# Patient Record
Sex: Female | Born: 1980 | Race: White | Hispanic: No | Marital: Married | Smoking: Current every day smoker
Health system: Southern US, Community
[De-identification: ages and names within clinical notes are randomized; demographics above are authoritative.]

## PROBLEM LIST (undated history)

## (undated) HISTORY — PX: OTHER SURGICAL HISTORY: SHX169

## (undated) HISTORY — PX: TUBAL LIGATION: SHX77

## (undated) HISTORY — PX: WISDOM TOOTH EXTRACTION: SHX21

---

## 2011-10-02 ENCOUNTER — Emergency Department (HOSPITAL_BASED_OUTPATIENT_CLINIC_OR_DEPARTMENT_OTHER)
Admission: EM | Admit: 2011-10-02 | Discharge: 2011-10-02 | Disposition: A | Discharge status: Home / Self Care | Payer: Self-pay | Attending: Emergency Medicine | Admitting: Emergency Medicine

## 2011-10-02 DIAGNOSIS — F17200 Nicotine dependence, unspecified, uncomplicated: Secondary | ICD-10-CM | POA: Insufficient documentation

## 2011-10-02 DIAGNOSIS — R109 Unspecified abdominal pain: Secondary | ICD-10-CM | POA: Insufficient documentation

## 2011-10-02 DIAGNOSIS — R51 Headache: Secondary | ICD-10-CM | POA: Insufficient documentation

## 2011-10-02 DIAGNOSIS — B349 Viral infection, unspecified: Secondary | ICD-10-CM

## 2011-10-02 DIAGNOSIS — B9789 Other viral agents as the cause of diseases classified elsewhere: Secondary | ICD-10-CM | POA: Insufficient documentation

## 2011-10-02 LAB — BASIC METABOLIC PANEL
BUN: 12 mg/dL (ref 6–23)
CO2: 25 mEq/L (ref 19–32)
Calcium: 10.2 mg/dL (ref 8.4–10.5)
GFR calc non Af Amer: >90 mL/min (ref >90)
Glucose, Bld: 96 mg/dL (ref 70–99)
Potassium: 4.2 mEq/L (ref 3.5–5.1)
Sodium: 137 mEq/L (ref 135–145)

## 2011-10-02 LAB — URINE MICROSCOPIC-ADD ON

## 2011-10-02 LAB — URINALYSIS, ROUTINE W REFLEX MICROSCOPIC
Bilirubin Urine: NEGATIVE
Ketones, ur: NEGATIVE mg/dL
Nitrite: NEGATIVE
Specific Gravity, Urine: 1.013 (ref 1.005–1.030)
Urobilinogen, UA: 0.2 mg/dL (ref 0.0–1.0)

## 2011-10-02 MED ORDER — FAMOTIDINE 20 MG PO TABS: 20.0000 mg | ORAL_TABLET | Freq: Two times a day (BID) | ORAL | Status: AC

## 2011-10-02 MED ORDER — GI COCKTAIL ~~LOC~~
30.0000 mL | Freq: Once | ORAL | Status: AC
  Administered 2011-10-02: 30 mL via ORAL
  Filled 2011-10-02: qty 30

## 2011-10-02 MED ORDER — ONDANSETRON HCL 4 MG PO TABS: 4.0000 mg | ORAL_TABLET | Freq: Four times a day (QID) | ORAL | Status: AC

## 2011-10-02 MED ORDER — FAMOTIDINE IN NACL 20-0.9 MG/50ML-% IV SOLN
20.0000 mg | Freq: Once | INTRAVENOUS | Status: AC
  Administered 2011-10-02: 20 mg via INTRAVENOUS
  Filled 2011-10-02: qty 50

## 2011-10-02 MED ORDER — SODIUM CHLORIDE 0.9 % IV BOLUS (SEPSIS)
1000.0000 mL | Freq: Once | INTRAVENOUS | Status: AC
  Administered 2011-10-02: 1000 mL via INTRAVENOUS

## 2011-10-02 MED ORDER — ONDANSETRON HCL 4 MG/2ML IJ SOLN
4.0000 mg | Freq: Once | INTRAMUSCULAR | Status: AC
  Administered 2011-10-02: 4 mg via INTRAVENOUS
  Filled 2011-10-02: qty 2

## 2011-10-02 MED ORDER — KETOROLAC TROMETHAMINE 30 MG/ML IJ SOLN
30.0000 mg | Freq: Once | INTRAMUSCULAR | Status: AC
  Administered 2011-10-02: 30 mg via INTRAVENOUS
  Filled 2011-10-02: qty 1

## 2011-10-02 NOTE — ED Provider Notes (Signed)
History     CSN: [id number] Arrival date & time: 10/02/2011  2:40 PM   First MD Initiated Contact with Patient 10/02/11 1527      Chief Complaint  Patient presents with  . Headache  . Abdominal Pain    (Consider location/radiation/quality/duration/timing/severity/associated sxs/prior treatment) HPI Patient p/w ~3d of complaints.  She notes that her Sx began gradually and since onset she has had persistent crampy mid abdominal pain, HA (R-sided, pressure-like) and anorexia w nausea.  She also c/o crusty discharge from her R eye.  The eye is not painful, nor does she c/o acuity changes, but she awakens each morning with crusty eyelids that need to be "pried" open.  She notes that w PO intake she feels worse.  No relief w OTC meds. No F/C, no emesis, diarrhea, no myalgia though there is generalized discomfort.  History reviewed. No pertinent past medical history.  Past Surgical History  Procedure Date  . Arm surgery     No family history on file.  History  Substance Use Topics  . Smoking status: Current Everyday Smoker  . Smokeless tobacco: Not on file  . Alcohol Use: No    OB History    Grav Para Term Preterm Abortions TAB SAB Ect Mult Living                  Review of Systems  Constitutional:       HPI  HENT:       HPI otherwise negative  Eyes: Negative.   Respiratory:       HPI, otherwise negative  Cardiovascular:       HPI, otherwise nmegative  Gastrointestinal: Negative for vomiting.  Genitourinary:       HPI, otherwise negative  Musculoskeletal:       HPI, otherwise negative  Skin: Negative.   Neurological: Negative for syncope.    Allergies  Review of patient's allergies indicates no known allergies.  Home Medications  No current outpatient prescriptions on file.  BP 127/71  Pulse 80  Temp(Src) 98.6 F (37 C) (Oral)  Resp 16  Ht 5\' 9"  (1.753 m)  Wt 184 lb (83.462 kg)  BMI 27.17 kg/m2  SpO2 100%  LMP 09/30/2011  Physical Exam  Nursing  note and vitals reviewed. Constitutional: She is oriented to person, place, and time. She appears well-developed and well-nourished. No distress.  HENT:  Head: Normocephalic and atraumatic.  Mouth/Throat: Oropharynx is clear and moist. No oropharyngeal exudate.  Eyes: Conjunctivae are normal. Pupils are equal, round, and reactive to light.       No discernable discharge  Neck: Neck supple.  Cardiovascular: Normal rate, regular rhythm, normal heart sounds and intact distal pulses.   Pulmonary/Chest: Effort normal and breath sounds normal. No stridor. No respiratory distress. She has no wheezes.  Abdominal: Soft. She exhibits no distension and no mass. There is no tenderness. There is no rebound and no guarding.  Musculoskeletal: She exhibits no edema and no tenderness.  Neurological: She is alert and oriented to person, place, and time. No cranial nerve deficit. She exhibits normal muscle tone. Coordination normal.  Skin: Skin is warm and dry. No rash noted. No erythema.  Psychiatric: She has a normal mood and affect.    ED Course  Procedures (including critical care time)   Labs Reviewed  URINALYSIS, ROUTINE W REFLEX MICROSCOPIC  PREGNANCY, URINE  BASIC METABOLIC PANEL   No results found.   No diagnosis found.   5:55 PM Patient notes that she  is feeling significantly better. MDM  This previously well young female presents with 3 days of generalized discomfort and right eye discharge.  Given the description of discharge, infectious etiology seems most likely. Following the patient's initial presentation she received antiemetics, analgesics. The patient noted significant improvement in her condition, and was discharged in stable condition to follow up with her primary care physician. She was not prescribe antibiotics, as there is no fever, no leukocytosis, no physical exam findings consistent with ongoing bacterial infection.        Gerhard Munch, MD 10/02/11 [phone]

## 2011-10-02 NOTE — ED Notes (Signed)
C/o HA behind right eye with "crusty" right eye-also c/o abd pressure/nausea-denies v/d

## 2011-10-02 NOTE — ED Notes (Signed)
Pt. Is being see by Dr. Jeraldine Loots at present time.

## 2011-10-02 NOTE — ED Notes (Signed)
Family at bedside. 

## 2011-10-02 NOTE — ED Notes (Signed)
Encouraged Pt. To urinate again.

## 2012-01-04 ENCOUNTER — Emergency Department (HOSPITAL_BASED_OUTPATIENT_CLINIC_OR_DEPARTMENT_OTHER)
Admission: EM | Admit: 2012-01-04 | Discharge: 2012-01-04 | Disposition: A | Discharge status: Home / Self Care | Payer: Medicaid Other | Attending: Emergency Medicine | Admitting: Emergency Medicine

## 2012-01-04 ENCOUNTER — Encounter (HOSPITAL_BASED_OUTPATIENT_CLINIC_OR_DEPARTMENT_OTHER): Payer: Self-pay | Admitting: *Deleted

## 2012-01-04 ENCOUNTER — Emergency Department (INDEPENDENT_AMBULATORY_CARE_PROVIDER_SITE_OTHER): Payer: Medicaid Other

## 2012-01-04 DIAGNOSIS — M545 Low back pain, unspecified: Secondary | ICD-10-CM | POA: Insufficient documentation

## 2012-01-04 DIAGNOSIS — M79609 Pain in unspecified limb: Secondary | ICD-10-CM | POA: Insufficient documentation

## 2012-01-04 DIAGNOSIS — S39012A Strain of muscle, fascia and tendon of lower back, initial encounter: Secondary | ICD-10-CM

## 2012-01-04 DIAGNOSIS — M5430 Sciatica, unspecified side: Secondary | ICD-10-CM

## 2012-01-04 LAB — URINALYSIS, ROUTINE W REFLEX MICROSCOPIC
Bilirubin Urine: NEGATIVE
Glucose, UA: NEGATIVE mg/dL
Hgb urine dipstick: NEGATIVE
Ketones, ur: NEGATIVE mg/dL
Leukocytes, UA: NEGATIVE
Nitrite: NEGATIVE
Protein, ur: NEGATIVE mg/dL
Specific Gravity, Urine: 1.02 (ref 1.005–1.030)
Urobilinogen, UA: 0.2 mg/dL (ref 0.0–1.0)
pH: 7.5 (ref 5.0–8.0)

## 2012-01-04 LAB — URINE MICROSCOPIC-ADD ON

## 2012-01-04 LAB — PREGNANCY, URINE: Preg Test, Ur: NEGATIVE

## 2012-01-04 MED ORDER — KETOROLAC TROMETHAMINE 60 MG/2ML IM SOLN
60.0000 mg | Freq: Once | INTRAMUSCULAR | Status: AC
  Administered 2012-01-04: 60 mg via INTRAMUSCULAR
  Filled 2012-01-04: qty 2

## 2012-01-04 MED ORDER — CYCLOBENZAPRINE HCL 10 MG PO TABS: 10.0000 mg | ORAL_TABLET | Freq: Three times a day (TID) | ORAL | Status: AC | PRN

## 2012-01-04 MED ORDER — PREDNISONE 50 MG PO TABS: 50.0000 mg | ORAL_TABLET | Freq: Every day | ORAL | Status: AC

## 2012-01-04 MED ORDER — OXYCODONE-ACETAMINOPHEN 5-325 MG PO TABS
1.0000 | ORAL_TABLET | Freq: Once | ORAL | Status: AC
  Administered 2012-01-04: 1 via ORAL
  Filled 2012-01-04: qty 1

## 2012-01-04 MED ORDER — HYDROCODONE-ACETAMINOPHEN 5-325 MG PO TABS: 1.0000 | ORAL_TABLET | Freq: Four times a day (QID) | ORAL | Status: AC | PRN

## 2012-01-04 NOTE — ED Notes (Signed)
Pt describes lower back pain x 6 weeks. Pain now radiating down the left leg. No urinary s/s.

## 2012-01-04 NOTE — ED Provider Notes (Signed)
Medical screening examination/treatment/procedure(s) were performed by non-physician practitioner and as supervising physician I was immediately available for consultation/collaboration.  Raeford Razor, MD 01/04/12 1620
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
## 2012-01-04 NOTE — Discharge Instructions (Signed)
Your x-rays were normal. Return here as needed. Follow up with the doctor provided. Use ice and heat on your lower back.

## 2012-01-05 LAB — URINE CULTURE
Colony Count: 40000
Culture  Setup Time: 201303172346

## 2012-05-03 ENCOUNTER — Encounter (HOSPITAL_BASED_OUTPATIENT_CLINIC_OR_DEPARTMENT_OTHER): Payer: Self-pay

## 2012-05-03 ENCOUNTER — Emergency Department (HOSPITAL_BASED_OUTPATIENT_CLINIC_OR_DEPARTMENT_OTHER): Payer: Self-pay

## 2012-05-03 ENCOUNTER — Emergency Department (HOSPITAL_BASED_OUTPATIENT_CLINIC_OR_DEPARTMENT_OTHER)
Admission: EM | Admit: 2012-05-03 | Discharge: 2012-05-03 | Disposition: A | Discharge status: Home / Self Care | Payer: Self-pay | Attending: Emergency Medicine | Admitting: Emergency Medicine

## 2012-05-03 DIAGNOSIS — S9030XA Contusion of unspecified foot, initial encounter: Secondary | ICD-10-CM

## 2012-05-03 DIAGNOSIS — Y9239 Other specified sports and athletic area as the place of occurrence of the external cause: Secondary | ICD-10-CM | POA: Insufficient documentation

## 2012-05-03 DIAGNOSIS — W2209XA Striking against other stationary object, initial encounter: Secondary | ICD-10-CM | POA: Insufficient documentation

## 2012-05-03 DIAGNOSIS — Z7982 Long term (current) use of aspirin: Secondary | ICD-10-CM | POA: Insufficient documentation

## 2012-05-03 DIAGNOSIS — F17200 Nicotine dependence, unspecified, uncomplicated: Secondary | ICD-10-CM | POA: Insufficient documentation

## 2012-05-03 DIAGNOSIS — Y92838 Other recreation area as the place of occurrence of the external cause: Secondary | ICD-10-CM | POA: Insufficient documentation

## 2012-05-03 MED ORDER — ACETAMINOPHEN-CODEINE #3 300-30 MG PO TABS
2.0000 | ORAL_TABLET | Freq: Once | ORAL | Status: AC
  Administered 2012-05-03: 2 via ORAL
  Filled 2012-05-03: qty 2

## 2012-05-03 MED ORDER — ACETAMINOPHEN-CODEINE #3 300-30 MG PO TABS: 1.0000 | ORAL_TABLET | Freq: Four times a day (QID) | ORAL | Status: AC | PRN

## 2012-05-03 NOTE — Discharge Instructions (Signed)
 Contusion A contusion is a deep bruise. Contusions are the result of an injury that caused bleeding under the skin. The contusion may turn blue, purple, or yellow. Minor injuries will give you a painless contusion, but more severe contusions may stay painful and swollen for a few weeks.  CAUSES  A contusion is usually caused by a blow, trauma, or direct force to an area of the body. SYMPTOMS   Swelling and redness of the injured area.   Bruising of the injured area.   Tenderness and soreness of the injured area.   Pain.  DIAGNOSIS  The diagnosis can be made by taking a history and physical exam. An X-ray, CT scan, or MRI may be needed to determine if there were any associated injuries, such as fractures. TREATMENT  Specific treatment will depend on what area of the body was injured. In general, the best treatment for a contusion is resting, icing, elevating, and applying cold compresses to the injured area. Over-the-counter medicines may also be recommended for pain control. Ask your caregiver what the best treatment is for your contusion. HOME CARE INSTRUCTIONS   Put ice on the injured area.   Put ice in a plastic bag.   Place a towel between your skin and the bag.   Leave the ice on for 15 to 20 minutes, 3 to 4 times a day.   Only take over-the-counter or prescription medicines for pain, discomfort, or fever as directed by your caregiver. Your caregiver may recommend avoiding anti-inflammatory medicines (aspirin, ibuprofen, and naproxen) for 48 hours because these medicines may increase bruising.   Rest the injured area.   If possible, elevate the injured area to reduce swelling.  SEEK IMMEDIATE MEDICAL CARE IF:   You have increased bruising or swelling.   You have pain that is getting worse.   Your swelling or pain is not relieved with medicines.  MAKE SURE YOU:   Understand these instructions.   Will watch your condition.   Will get help right away if you are not  doing well or get worse.  Document Released: 07/16/2005 Document Revised: 09/25/2011 Document Reviewed: 08/11/2011 [facility] Patient Information 2012 [place], [place].    Narcotic and benzodiazepine use may cause drowsiness, slowed breathing or dependence.  Please use with caution and do not drive, operate machinery or watch young children alone while taking them.  Taking combinations of these medications or drinking alcohol will potentiate these effects.

## 2012-05-03 NOTE — ED Provider Notes (Signed)
History  This chart was scribed for Karen Allen. Oletta Lamas, MD by Bennett Scrape. This patient was seen in room MH02/MH02 and the patient's care was started at 3:07PM.  CSN: [id number]  Arrival date & time 05/03/12  1449   First MD Initiated Contact with Patient 05/03/12 1507      Chief Complaint  Patient presents with  . Foot Injury    The history is provided by the patient. No language interpreter was used.   Karen Allen is a 31 y.o. female who presents to the Emergency Department complaining of 2 days of sudden onset, gradually worsening, constant left foot pain after hitting the outer side of her foot on a water slide. She states that she was initially able to walk, pain got worse as time passed. The pain is worse when she flexes the foot upward and with pressure. She reports taking aleve and tylenol with no improvement in her symptoms. She denies using ice to improve pain.  She denies any other symptoms or injuries. She does not have a h/o chronic medical conditions. She is a current everyday smoker but denies alcohol use.   History reviewed. No pertinent past medical history.  Past Surgical History  Procedure Date  . Arm surgery   . Tubal ligation   . Tubal ligation   . Wisdom tooth extraction     History reviewed. No pertinent family history.  History  Substance Use Topics  . Smoking status: Current Everyday Smoker  . Smokeless tobacco: Not on file  . Alcohol Use: No    No OB history.  Review of Systems  Constitutional: Negative for fever and chills.  Musculoskeletal: Positive for joint swelling. Negative for back pain.       Left foot pain  Skin: Positive for color change and wound. Negative for pallor and rash.  Neurological: Negative for weakness and numbness.  Hematological: Does not bruise/bleed easily.       Allergies  Review of patient's allergies indicates no known allergies.  Home Medications   Current Outpatient Rx  Name Route Sig Dispense Refill    . ASPIRIN-ACETAMINOPHEN-CAFFEINE 250-250-65 MG PO TABS Oral Take 1 tablet by mouth every 6 (six) hours as needed. Patient used this medication for pain.    Marland Kitchen FAMOTIDINE 20 MG PO TABS Oral Take 1 tablet (20 mg total) by mouth 2 (two) times daily. 30 tablet 0  . NAPROXEN SODIUM 220 MG PO TABS Oral Take 220 mg by mouth 2 (two) times daily with a meal. Patient used this medication for back pain.    . ACETAMINOPHEN-CODEINE #3 300-30 MG PO TABS Oral Take 1-2 tablets by mouth every 6 (six) hours as needed for pain. 15 tablet 0    Triage Vitals: BP 113/63  Pulse 92  Temp 98.5 F (36.9 C) (Oral)  Resp 16  Ht 5\' 9"  (1.753 m)  Wt 180 lb (81.647 kg)  BMI 26.58 kg/m2  SpO2 100%  LMP 04/29/2012  Physical Exam  Nursing note and vitals reviewed. Constitutional: She is oriented to person, place, and time. She appears well-developed and well-nourished. No distress.  HENT:  Head: Normocephalic and atraumatic.  Eyes: EOM are normal.  Neck: Neck supple. No tracheal deviation present.  Cardiovascular: Normal rate.   Pulmonary/Chest: Effort normal. No respiratory distress.  Musculoskeletal: Normal range of motion. She exhibits tenderness. She exhibits no edema.       Compartments are soft, no deformities, both malleoli are non-tender, no tenderness along the 5th metatarsal, thompson and achilles  tests are both negative. Dorsum of foot is tender, mildly withotu crepitus or deformity on left foot  Neurological: She is alert and oriented to person, place, and time.  Skin: Skin is warm and dry. No rash noted. No pallor.  Psychiatric: She has a normal mood and affect. Her behavior is normal.     ED Course  Procedures (including critical care time)  DIAGNOSTIC STUDIES: Oxygen Saturation is 100% on room air, normal by my interpretation.    COORDINATION OF CARE: 3:22PM-Informed pt of negative x-ray and discussed discharge plan. Pt agreed and requested a work note for today and tomorrow.    Labs  Reviewed - No data to display Dg Foot Complete Left  05/03/2012  *RADIOLOGY REPORT*  Clinical Data: Injury.  Pain.  LEFT FOOT - COMPLETE 3+ VIEW  Comparison: None.  Findings: No fracture or dislocation.  IMPRESSION: No fracture.  Original Report Authenticated By: Fuller Canada, M.D.   I reviewed the above film myself and reviewed with the patient.   1. Foot contusion       MDM  I personally performed the services described in this documentation, which was scribed in my presence. The recorded information has been reviewed and considered.      Pt with contusion to foot, was ambulatory, doubt fracture.  Confirmed by plain films.  RICE at home.         Karen Allen. Rayhaan Huster, MD 05/03/12 1616

## 2012-05-03 NOTE — ED Notes (Signed)
Injury to left foot Saturday.

## 2013-10-23 IMAGING — CR DG LUMBAR SPINE COMPLETE 4+V
5 series · 5 of 5 positions shown · non-contrast
Comparison: None.

CLINICAL DATA: Low back pain

LUMBAR SPINE - COMPLETE 4+ VIEW

[t l-spine a.p.]
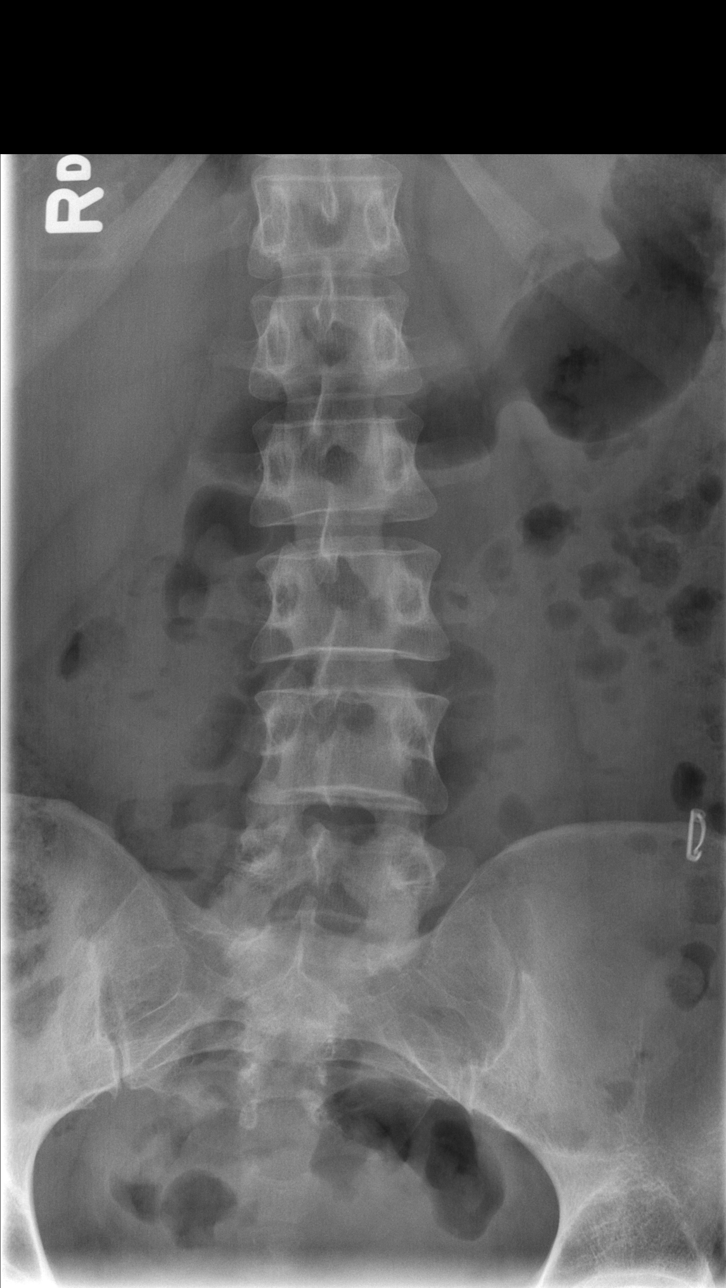

[t l-spine oblique exposure (1 of 2)]
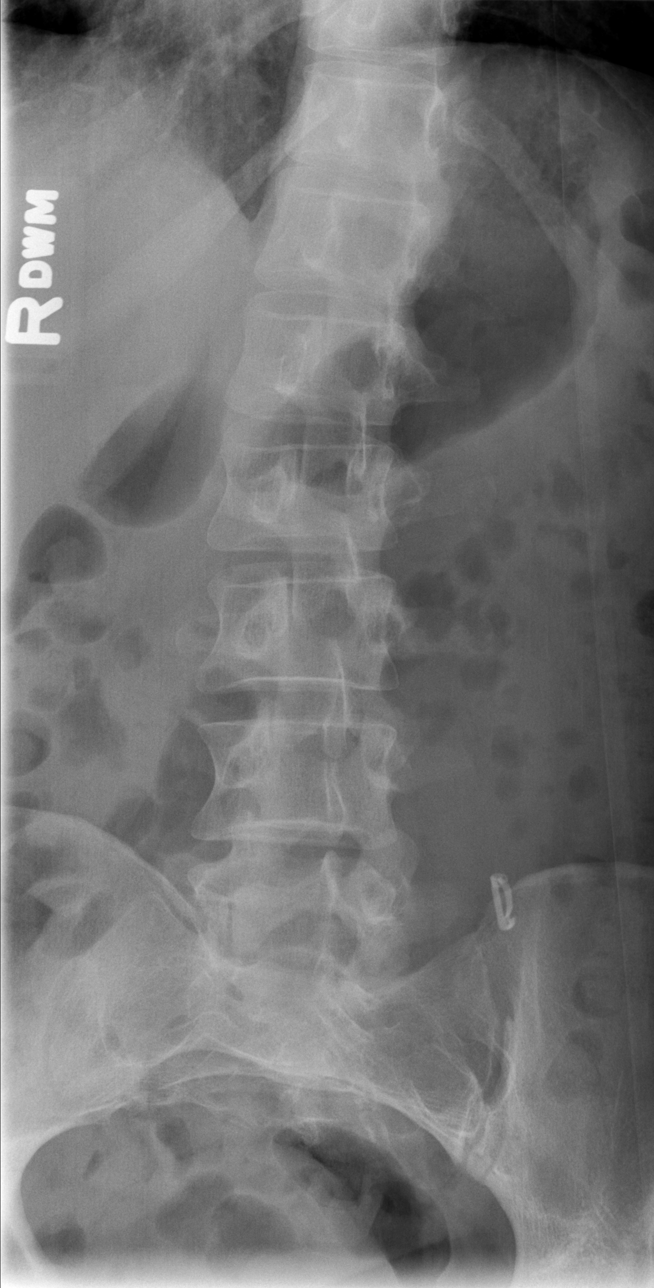

[t l-spine oblique exposure (2 of 2)]
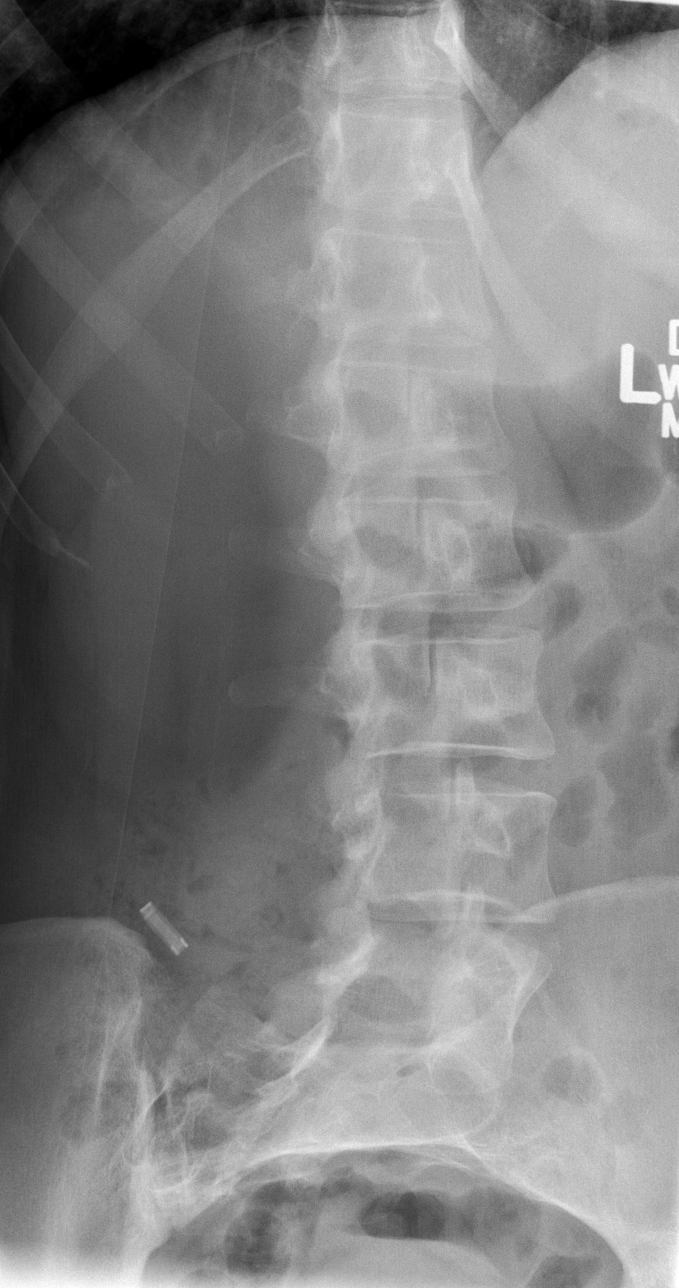

[t l-spine lat]
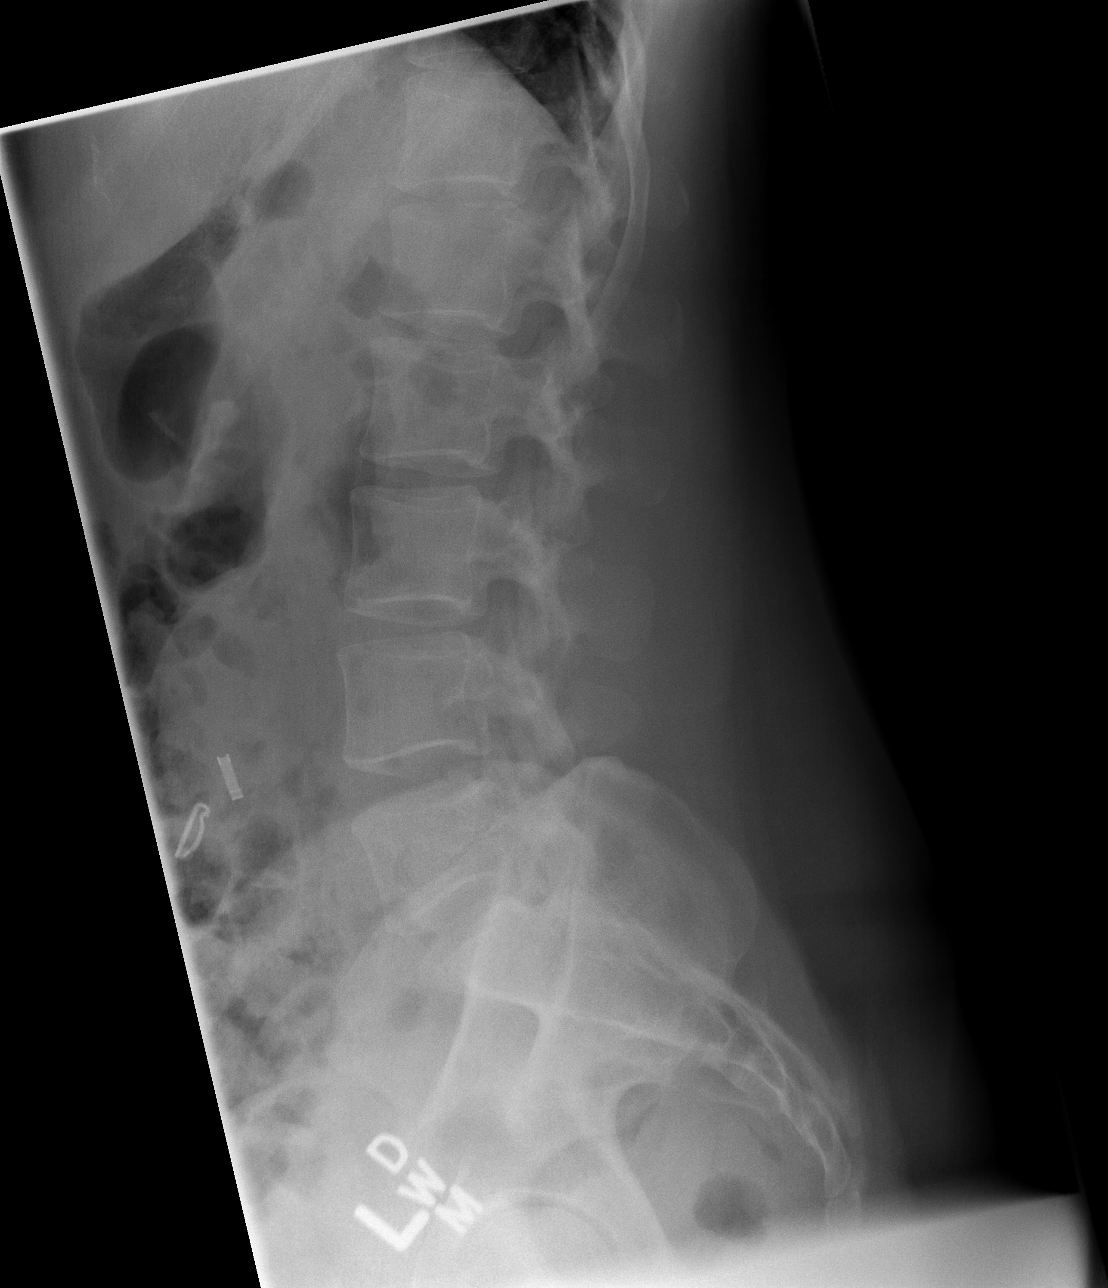

[t l-spine l5-s1 spot]
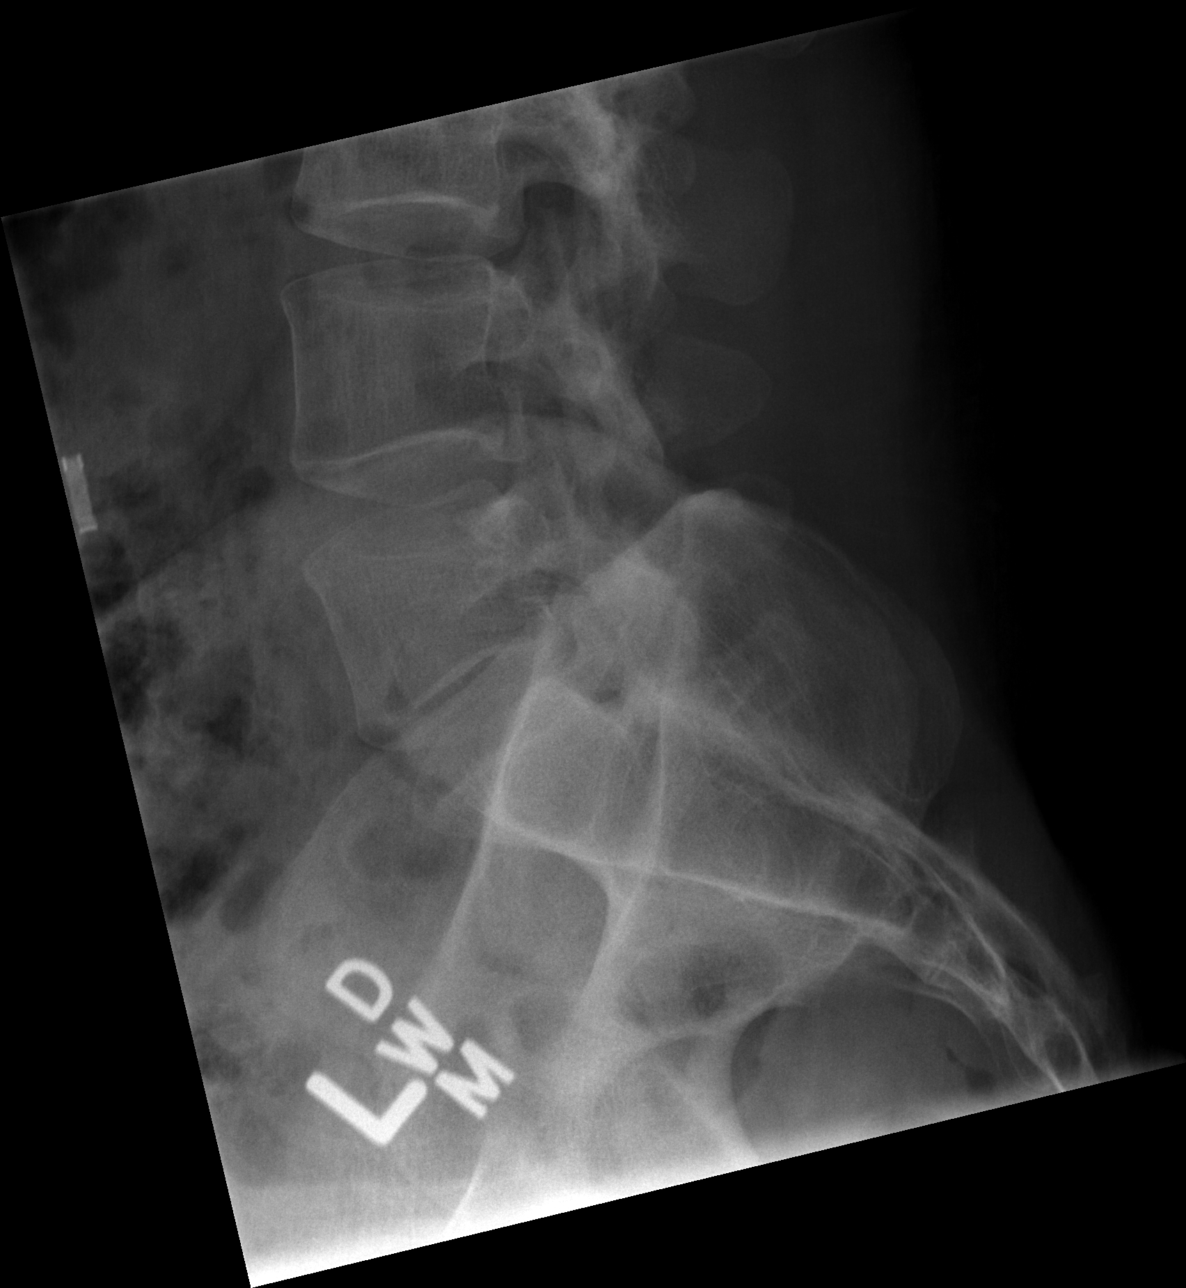

[5 of 5 positions shown; findings below may reference images not displayed]

FINDINGS: Five views of the lumbar spine submitted.  Mild
levoscoliosis.  No acute fracture or subluxation.  Disc spaces and
vertebral height are preserved.
IMPRESSION: No acute fracture or subluxation.

## 2014-01-11 ENCOUNTER — Encounter (HOSPITAL_BASED_OUTPATIENT_CLINIC_OR_DEPARTMENT_OTHER): Payer: Self-pay | Admitting: Emergency Medicine

## 2014-01-11 ENCOUNTER — Emergency Department (HOSPITAL_BASED_OUTPATIENT_CLINIC_OR_DEPARTMENT_OTHER)
Admission: EM | Admit: 2014-01-11 | Discharge: 2014-01-11 | Disposition: A | Discharge status: Home / Self Care | Payer: Medicaid Other | Attending: Emergency Medicine | Admitting: Emergency Medicine

## 2014-01-11 DIAGNOSIS — Z79899 Other long term (current) drug therapy: Secondary | ICD-10-CM | POA: Insufficient documentation

## 2014-01-11 DIAGNOSIS — Y939 Activity, unspecified: Secondary | ICD-10-CM | POA: Insufficient documentation

## 2014-01-11 DIAGNOSIS — Z7982 Long term (current) use of aspirin: Secondary | ICD-10-CM | POA: Insufficient documentation

## 2014-01-11 DIAGNOSIS — K047 Periapical abscess without sinus: Secondary | ICD-10-CM

## 2014-01-11 DIAGNOSIS — X58XXXA Exposure to other specified factors, initial encounter: Secondary | ICD-10-CM | POA: Insufficient documentation

## 2014-01-11 DIAGNOSIS — Z791 Long term (current) use of non-steroidal anti-inflammatories (NSAID): Secondary | ICD-10-CM | POA: Insufficient documentation

## 2014-01-11 DIAGNOSIS — S02609A Fracture of mandible, unspecified, initial encounter for closed fracture: Secondary | ICD-10-CM | POA: Insufficient documentation

## 2014-01-11 DIAGNOSIS — F17200 Nicotine dependence, unspecified, uncomplicated: Secondary | ICD-10-CM | POA: Insufficient documentation

## 2014-01-11 DIAGNOSIS — Y929 Unspecified place or not applicable: Secondary | ICD-10-CM | POA: Insufficient documentation

## 2014-01-11 MED ORDER — AMOXICILLIN-POT CLAVULANATE 875-125 MG PO TABS: 1.0000 | ORAL_TABLET | Freq: Two times a day (BID) | ORAL | Status: AC

## 2014-01-11 MED ORDER — HYDROCODONE-ACETAMINOPHEN 5-325 MG PO TABS: 2.0000 | ORAL_TABLET | ORAL | Status: AC | PRN

## 2014-01-11 NOTE — Discharge Instructions (Signed)
°  Dental Abscess °A dental abscess is a collection of infected fluid (pus) from a bacterial infection in the inner part of the tooth (pulp). It usually occurs at the end of the tooth's root.  °CAUSES  °· Severe tooth decay. °· Trauma to the tooth that allows bacteria to enter into the pulp, such as a broken or chipped tooth. °SYMPTOMS  °· Severe pain in and around the infected tooth. °· Swelling and redness around the abscessed tooth or in the mouth or face. °· Tenderness. °· Pus drainage. °· Bad breath. °· Bitter taste in the mouth. °· Difficulty swallowing. °· Difficulty opening the mouth. °· Nausea. °· Vomiting. °· Chills. °· Swollen neck glands. °DIAGNOSIS  °· A medical and dental history will be taken. °· An examination will be performed by tapping on the abscessed tooth. °· X-rays may be taken of the tooth to identify the abscess. °TREATMENT °The goal of treatment is to eliminate the infection. You may be prescribed antibiotic medicine to stop the infection from spreading. A root canal may be performed to save the tooth. If the tooth cannot be saved, it may be pulled (extracted) and the abscess may be drained.  °HOME CARE INSTRUCTIONS °· Only take over-the-counter or prescription medicines for pain, fever, or discomfort as directed by your caregiver. °· Rinse your mouth (gargle) often with salt water (¼ tsp salt in 8 oz [250 ml] of warm water) to relieve pain or swelling. °· Do not drive after taking pain medicine (narcotics). °· Do not apply heat to the outside of your face. °· Return to your dentist for further treatment as directed. °SEEK MEDICAL CARE IF: °· Your pain is not helped by medicine. °· Your pain is getting worse instead of better. °SEEK IMMEDIATE MEDICAL CARE IF: °· You have a fever or persistent symptoms for more than 2 3 days. °· You have a fever and your symptoms suddenly get worse. °· You have chills or a very bad headache. °· You have problems breathing or swallowing. °· You have trouble  opening your mouth. °· You have swelling in the neck or around the eye. °Document Released: 10/06/2005 Document Revised: 06/30/2012 Document Reviewed: 01/14/2011 °[employer]® Patient Information ©2014 [employer]. ° ° °

## 2014-01-11 NOTE — ED Notes (Signed)
Pt c/o R lower toothache x 2 days. Pt sts she had a filling come out about a year ago.

## 2014-01-11 NOTE — ED Provider Notes (Signed)
CSN: [id number]     Arrival date & time 01/11/14  1144 History   First MD Initiated Contact with Patient 01/11/14 1237     Chief Complaint  Patient presents with  . Dental Pain     (Consider location/radiation/quality/duration/timing/severity/associated sxs/prior Treatment) HPI Comments: Presents to the ER for evaluation of toothache. Patient reports that she has a tooth that is partially fractured on the right lower side of her mouth. She started to have sensitivity over the last couple of weeks, but in the last 2 days has had increasing pain. Pain is now severe and constant. She is also noted swelling of the right side of her face. Patient reports that she has scheduled followup with a dentist for March 31.  Patient is a 33 y.o. female presenting with tooth pain.  Dental Pain   History reviewed. No pertinent past medical history. Past Surgical History  Procedure Laterality Date  . Arm surgery    . Tubal ligation    . Tubal ligation    . Wisdom tooth extraction     No family history on file. History  Substance Use Topics  . Smoking status: Current Every Day Smoker  . Smokeless tobacco: Not on file  . Alcohol Use: No   OB History   Grav Para Term Preterm Abortions TAB SAB Ect Mult Living                 Review of Systems  HENT: Positive for dental problem.   All other systems reviewed and are negative.      Allergies  Review of patient's allergies indicates no known allergies.  Home Medications   Current Outpatient Rx  Name  Route  Sig  Dispense  Refill  . amoxicillin-clavulanate (AUGMENTIN) 875-125 MG per tablet   Oral   Take 1 tablet by mouth 2 (two) times daily.   20 tablet   0   . aspirin-acetaminophen-caffeine (EXCEDRIN MIGRAINE) 250-250-65 MG per tablet   Oral   Take 1 tablet by mouth every 6 (six) hours as needed. Patient used this medication for pain.         Marland Kitchen. EXPIRED: famotidine (PEPCID) 20 MG tablet   Oral   Take 1 tablet (20 mg total) by  mouth 2 (two) times daily.   30 tablet   0   . HYDROcodone-acetaminophen (NORCO/VICODIN) 5-325 MG per tablet   Oral   Take 2 tablets by mouth every 4 (four) hours as needed for moderate pain.   20 tablet   0   . naproxen sodium (ANAPROX) 220 MG tablet   Oral   Take 220 mg by mouth 2 (two) times daily with a meal. Patient used this medication for back pain.          BP 126/66  Pulse 66  Temp(Src) 98.7 F (37.1 C) (Oral)  Ht 5\' 9"  (1.753 m)  Wt 185 lb (83.915 kg)  BMI 27.31 kg/m2  SpO2 100%  LMP 01/09/2014 Physical Exam  Constitutional: She is oriented to person, place, and time. She appears well-developed and well-nourished. No distress.  HENT:  Head: Normocephalic and atraumatic.  Right Ear: Hearing normal.  Left Ear: Hearing normal.  Nose: Nose normal.  Mouth/Throat: Oropharynx is clear and moist and mucous membranes are normal. Dental abscesses present.    Eyes: Conjunctivae and EOM are normal. Pupils are equal, round, and reactive to light.  Neck: Normal range of motion. Neck supple.  Cardiovascular: Regular rhythm, S1 normal and S2 normal.  Exam  reveals no gallop and no friction rub.   No murmur heard. Pulmonary/Chest: Effort normal and breath sounds normal. No respiratory distress. She exhibits no tenderness.  Abdominal: Soft. Normal appearance and bowel sounds are normal. There is no hepatosplenomegaly. There is no tenderness. There is no rebound, no guarding, no tenderness at McBurney's point and negative Murphy's sign. No hernia.  Musculoskeletal: Normal range of motion.  Neurological: She is alert and oriented to person, place, and time. She has normal strength. No cranial nerve deficit or sensory deficit. Coordination normal. GCS eye subscore is 4. GCS verbal subscore is 5. GCS motor subscore is 6.  Skin: Skin is warm, dry and intact. No rash noted. No cyanosis.  Psychiatric: She has a normal mood and affect. Her speech is normal and behavior is normal. Thought  content normal.    ED Course  Procedures (including critical care time) Labs Review Labs Reviewed - No data to display Imaging Review No results found.   EKG Interpretation None      MDM   Final diagnoses:  Dental abscess    She has mild swelling of the right side of her mandible associated with a fracture, decayed right lower molar. She does have dental followup in one week. Patient will be treated with Augmentin and Vicodin. Return if symptoms worsen.    Gilda Crease, MD 01/11/14 1255

## 2014-02-20 IMAGING — CR DG FOOT COMPLETE 3+V*L*
3 series · 3 of 3 positions shown · non-contrast
Comparison: None.

CLINICAL DATA: Injury.  Pain.

LEFT FOOT - COMPLETE 3+ VIEW

[t foot ap left]
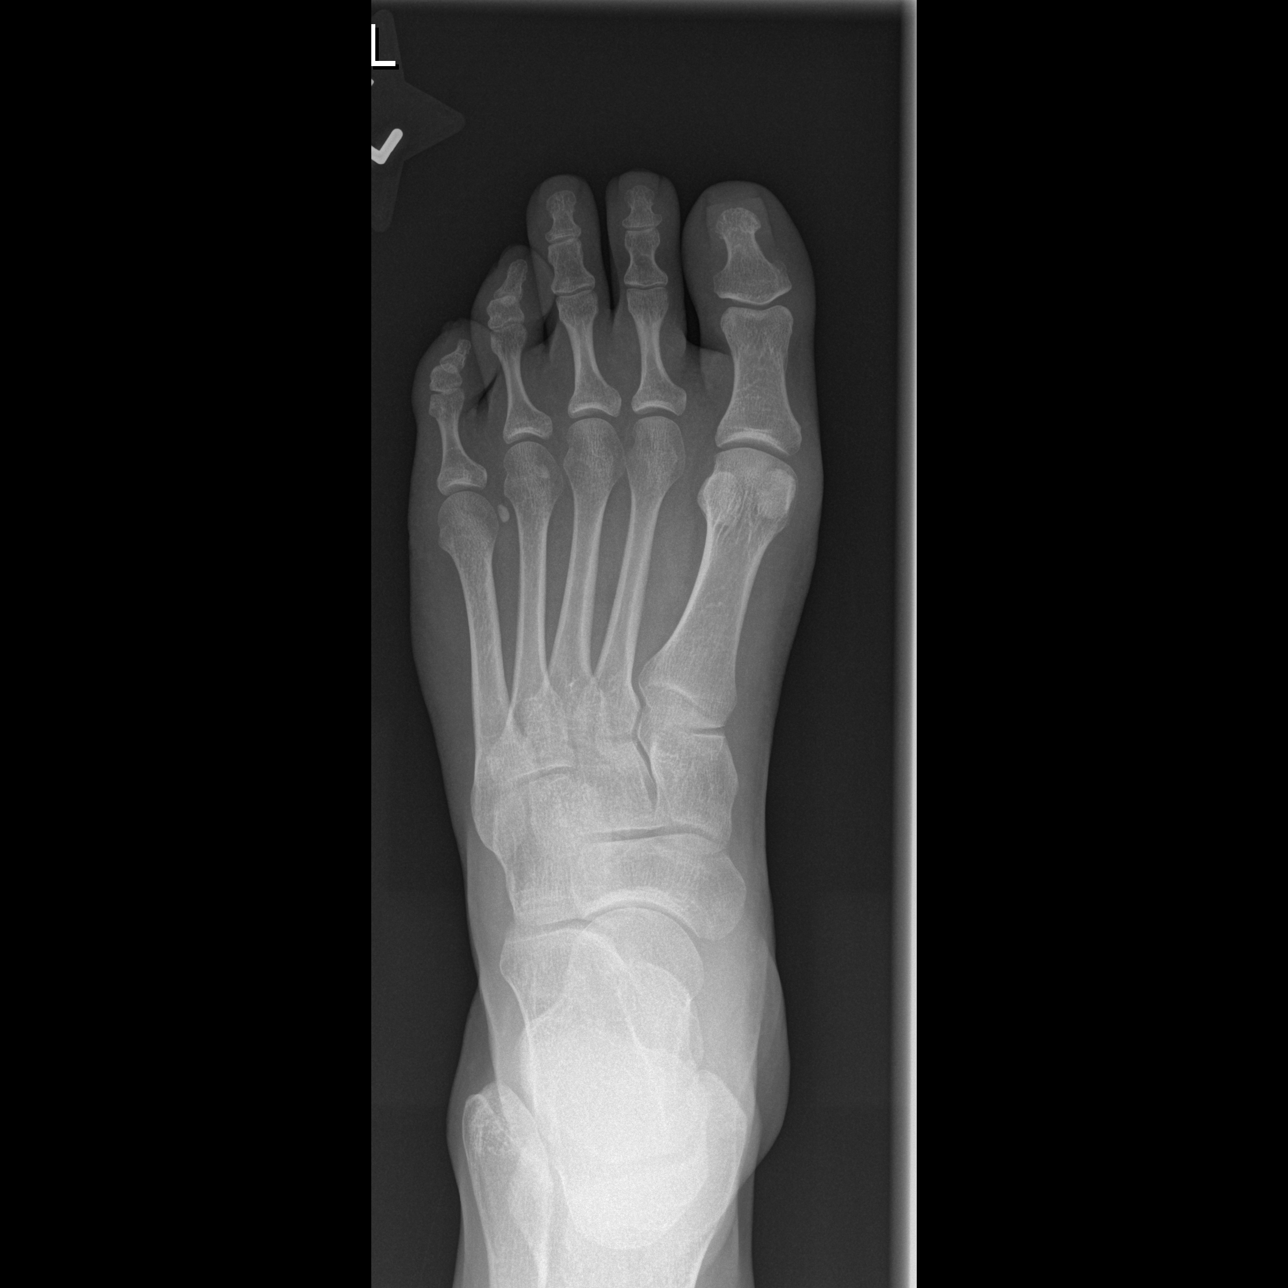

[t foot oblique left]
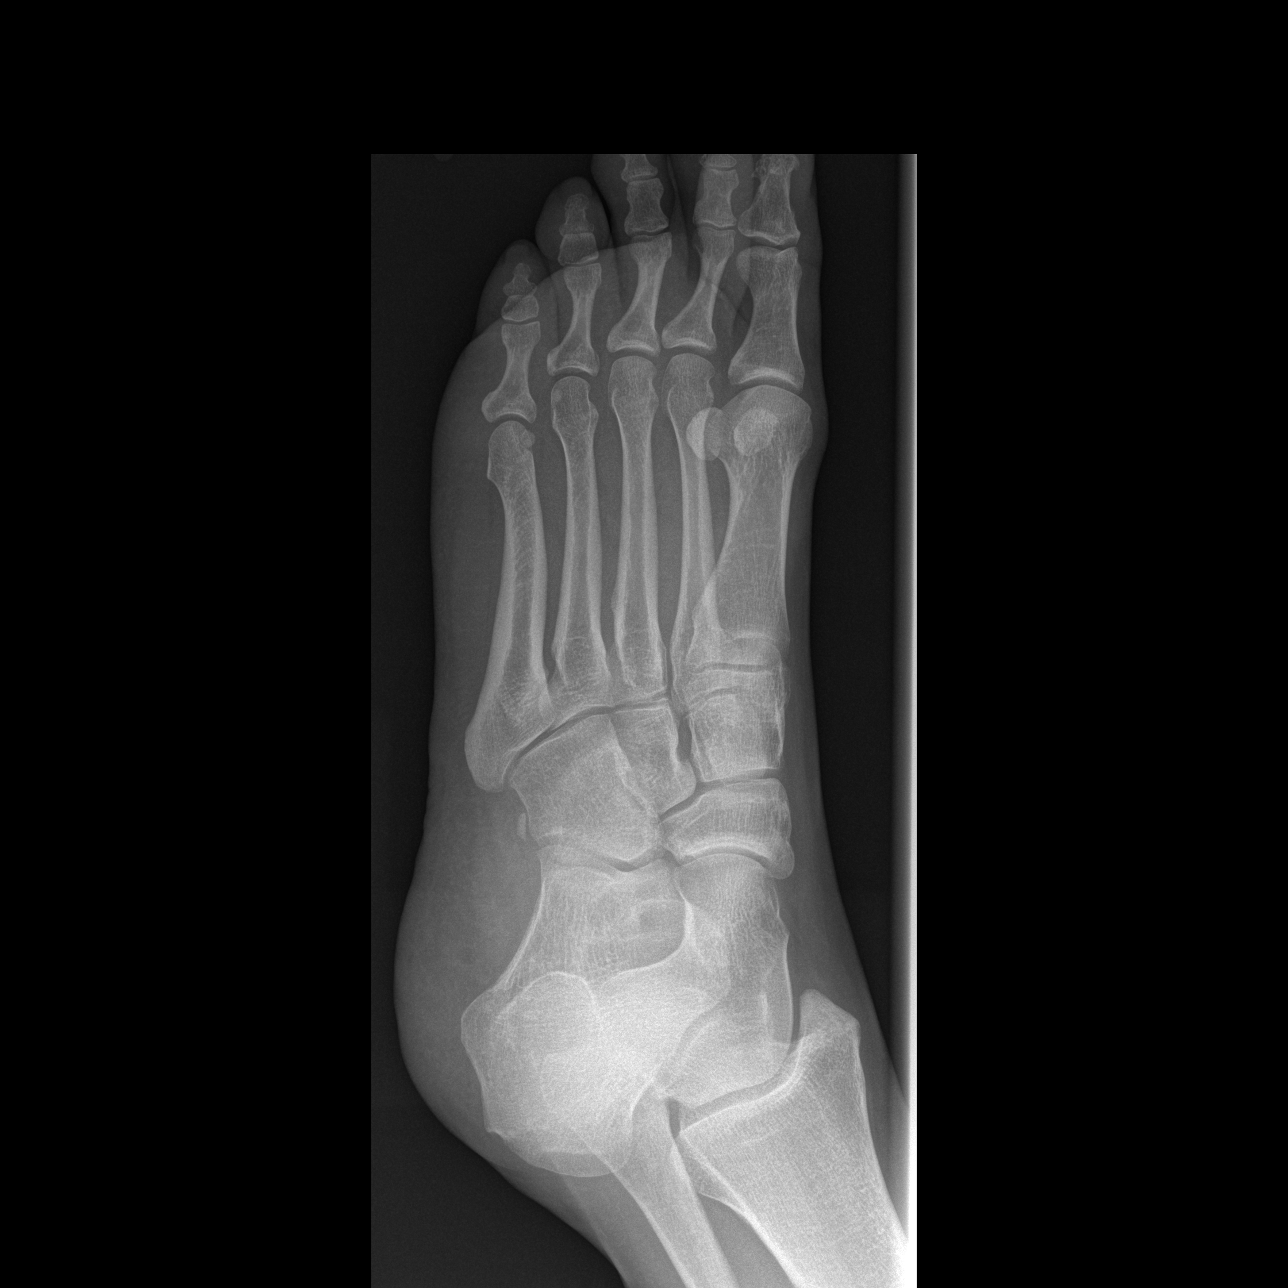

[t foot lat left]
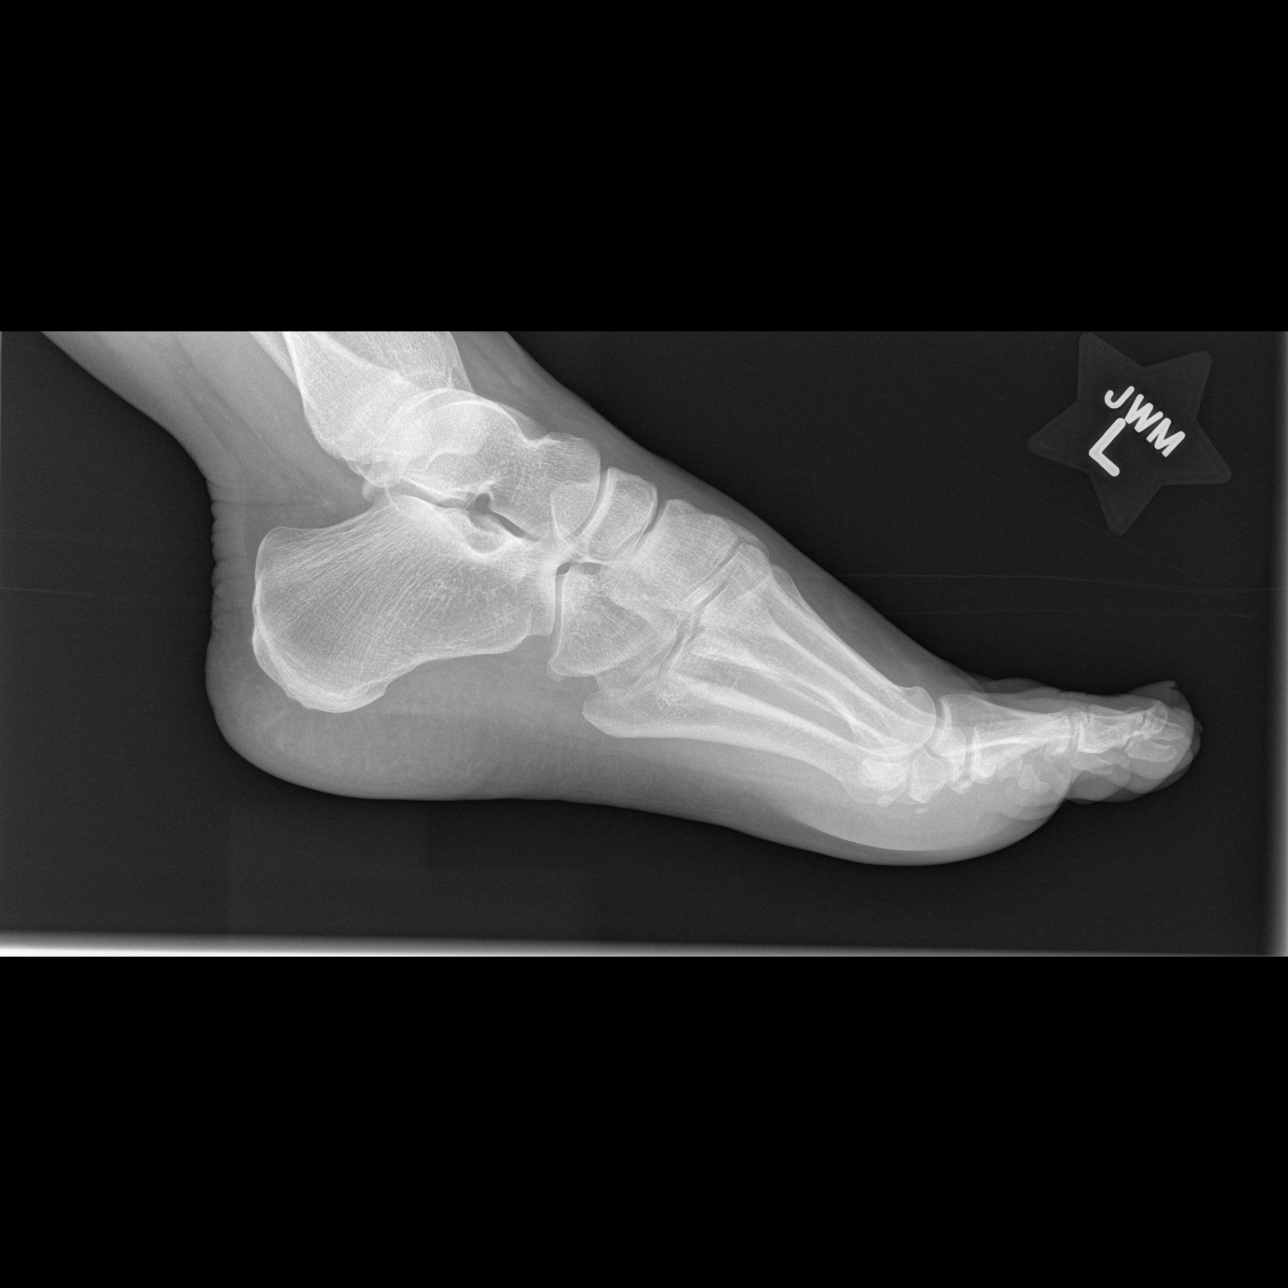

[3 of 3 positions shown; findings below may reference images not displayed]

FINDINGS: No fracture or dislocation.
IMPRESSION: No fracture.
